# Patient Record
Sex: Male | Born: 2005 | Race: White | Hispanic: No | Marital: Single | Smoking: Never smoker
Health system: Southern US, Community
[De-identification: ages and names within clinical notes are randomized; demographics above are authoritative.]

## PROBLEM LIST (undated history)

## (undated) HISTORY — PX: TOOTH EXTRACTION: SUR596

---
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
## 2024-04-05 ENCOUNTER — Other Ambulatory Visit (HOSPITAL_COMMUNITY): Payer: Self-pay

## 2024-04-05 ENCOUNTER — Other Ambulatory Visit (HOSPITAL_BASED_OUTPATIENT_CLINIC_OR_DEPARTMENT_OTHER): Payer: Self-pay
